# Patient Record
Sex: Female | Born: 1965 | Race: White | Hispanic: No | Marital: Single
Health system: Southern US, Community
[De-identification: ages and names within clinical notes are randomized; demographics above are authoritative.]

---

## 2017-02-16 ENCOUNTER — Emergency Department: Payer: BLUE CROSS/BLUE SHIELD

## 2017-02-16 ENCOUNTER — Emergency Department
Admission: EM | Admit: 2017-02-16 | Discharge: 2017-02-16 | Disposition: A | Discharge status: Home / Self Care | Payer: BLUE CROSS/BLUE SHIELD | Attending: Student in an Organized Health Care Education/Training Program | Admitting: Student in an Organized Health Care Education/Training Program

## 2017-02-16 DIAGNOSIS — Z87442 Personal history of urinary calculi: Secondary | ICD-10-CM | POA: Insufficient documentation

## 2017-02-16 DIAGNOSIS — R109 Unspecified abdominal pain: Secondary | ICD-10-CM

## 2017-02-16 LAB — CBC WITH DIFFERENTIAL/PLATELET
BASOS ABS: 0.1 10*3/uL (ref 0–0.1)
Basophils Relative: 1 %
EOS ABS: 0.1 10*3/uL (ref 0–0.7)
EOS PCT: 3 %
HCT: 38.4 % (ref 35.0–47.0)
HEMOGLOBIN: 13.4 g/dL (ref 12.0–16.0)
LYMPHS PCT: 38 %
Lymphs Abs: 2.2 10*3/uL (ref 1.0–3.6)
MCH: 31.8 pg (ref 26.0–34.0)
MCHC: 35 g/dL (ref 32.0–36.0)
MCV: 90.7 fL (ref 80.0–100.0)
Monocytes Absolute: 0.5 10*3/uL (ref 0.2–0.9)
Monocytes Relative: 8 %
NEUTROS PCT: 50 %
Neutro Abs: 3 10*3/uL (ref 1.4–6.5)
PLATELETS: 259 10*3/uL (ref 150–440)
RBC: 4.23 MIL/uL (ref 3.80–5.20)
RDW: 12.8 % (ref 11.5–14.5)
WBC: 5.9 10*3/uL (ref 3.6–11.0)

## 2017-02-16 LAB — BASIC METABOLIC PANEL
ANION GAP: 9 (ref 5–15)
BUN: 8 mg/dL (ref 6–20)
CHLORIDE: 104 mmol/L (ref 101–111)
CO2: 26 mmol/L (ref 22–32)
Calcium: 9.7 mg/dL (ref 8.9–10.3)
Creatinine, Ser: 0.9 mg/dL (ref 0.44–1.00)
GFR calc Af Amer: >60 mL/min (ref >60)
Glucose, Bld: 125 mg/dL — ABNORMAL HIGH (ref 65–99)
POTASSIUM: 3.5 mmol/L (ref 3.5–5.1)
SODIUM: 139 mmol/L (ref 135–145)

## 2017-02-16 LAB — URINALYSIS, COMPLETE (UACMP) WITH MICROSCOPIC
BILIRUBIN URINE: NEGATIVE
GLUCOSE, UA: NEGATIVE mg/dL
HGB URINE DIPSTICK: NEGATIVE
KETONES UR: 5 mg/dL — AB
LEUKOCYTES UA: NEGATIVE
Nitrite: NEGATIVE
PROTEIN: NEGATIVE mg/dL
Specific Gravity, Urine: 1.003 — ABNORMAL LOW (ref 1.005–1.030)
pH: 6 (ref 5.0–8.0)

## 2017-02-16 MED ORDER — CYCLOBENZAPRINE HCL 10 MG PO TABS: 10.0000 mg | ORAL_TABLET | Freq: Three times a day (TID) | ORAL | 0 refills | Status: AC | PRN

## 2017-02-16 MED ORDER — KETOROLAC TROMETHAMINE 30 MG/ML IJ SOLN
15.0000 mg | Freq: Once | INTRAMUSCULAR | Status: AC
  Administered 2017-02-16: 15 mg via INTRAVENOUS
  Filled 2017-02-16: qty 1

## 2017-02-16 MED ORDER — NAPROXEN 375 MG PO TABS: 375.0000 mg | ORAL_TABLET | Freq: Two times a day (BID) | ORAL | 0 refills | Status: AC

## 2017-02-16 MED ORDER — SODIUM CHLORIDE 0.9 % IV BOLUS (SEPSIS)
1000.0000 mL | Freq: Once | INTRAVENOUS | Status: AC
  Administered 2017-02-16: 1000 mL via INTRAVENOUS

## 2017-02-16 NOTE — Discharge Instructions (Signed)

## 2017-02-16 NOTE — ED Provider Notes (Signed)
[facility] Emergency Department Provider Note    First MD Initiated Contact with Patient 02/16/17 1319     (approximate)  I have reviewed the triage vital signs and the nursing notes.   HISTORY  Chief Complaint Flank Pain    HPI Michelle Mcgee is a 51 y.o. female who reportedly has a history of kidney stones presents with chief complaint of left-sided flank pain and trouble using the restroom over the past 3 days. No fevers. No nausea or vomiting. States that in the past she would hear the splash when she was urinating and that's how she knew she had a kidney stone. Says that she is not hearing the splash now. States that she is concerned that she is not passing the stones.  Discussed the pain as mild to moderate in severity.   No past medical history on file. No family history on file. No past surgical history on file. There are no active problems to display for this patient.     Prior to Admission medications   Not on File    Allergies Patient has no allergy information on record.    Social History Social History  Substance Use Topics  . Smoking status: Not on file  . Smokeless tobacco: Not on file  . Alcohol use Not on file    Review of Systems Patient denies headaches, rhinorrhea, blurry vision, numbness, shortness of breath, chest pain, edema, cough, abdominal pain, nausea, vomiting, diarrhea, dysuria, fevers, rashes or hallucinations unless otherwise stated above in HPI. ____________________________________________   PHYSICAL EXAM:  VITAL SIGNS: Vitals:   02/16/17 1322  BP: (!) 144/89  Pulse: 90  Resp: 16  Temp: 98.4 F (36.9 C)  SpO2: 100%    Constitutional: Alert and oriented. Well appearing and in no acute distress. Eyes: Conjunctivae are normal.  Head: Atraumatic. Nose: No congestion/rhinnorhea. Mouth/Throat: Mucous membranes are moist.   Neck: No stridor. Painless ROM.  Cardiovascular: Normal rate, regular  rhythm. Grossly normal heart sounds.  Good peripheral circulation. Respiratory: Normal respiratory effort.  No retractions. Lungs CTAB. Gastrointestinal: Soft and nontender. No distention. No abdominal bruits. No CVA tenderness. Musculoskeletal: No lower extremity tenderness nor edema.  No joint effusions. Neurologic:  Normal speech and language. No gross focal neurologic deficits are appreciated. No facial droop Skin:  Skin is warm, dry and intact. No rash noted. Psychiatric: Mood and affect are normal. Speech and behavior are normal.  ____________________________________________   LABS (all labs ordered are listed, but only abnormal results are displayed)  Results for orders placed or performed during the hospital encounter of 02/16/17 (from the past 24 hour(s))  CBC with Differential/Platelet     Status: None   Collection Time: 02/16/17  1:23 PM  Result Value Ref Range   WBC 5.9 3.6 - 11.0 K/uL   RBC 4.23 3.80 - 5.20 MIL/uL   Hemoglobin 13.4 12.0 - 16.0 g/dL   HCT 16.1 09.6 - 04.5 %   MCV 90.7 80.0 - 100.0 fL   MCH 31.8 26.0 - 34.0 pg   MCHC 35.0 32.0 - 36.0 g/dL   RDW 40.9 81.1 - 91.4 %   Platelets 259 150 - 440 K/uL   Neutrophils Relative % 50 %   Neutro Abs 3.0 1.4 - 6.5 K/uL   Lymphocytes Relative 38 %   Lymphs Abs 2.2 1.0 - 3.6 K/uL   Monocytes Relative 8 %   Monocytes Absolute 0.5 0.2 - 0.9 K/uL   Eosinophils Relative 3 %  Eosinophils Absolute 0.1 0 - 0.7 K/uL   Basophils Relative 1 %   Basophils Absolute 0.1 0 - 0.1 K/uL  Basic metabolic panel     Status: Abnormal   Collection Time: 02/16/17  1:23 PM  Result Value Ref Range   Sodium 139 135 - 145 mmol/L   Potassium 3.5 3.5 - 5.1 mmol/L   Chloride 104 101 - 111 mmol/L   CO2 26 22 - 32 mmol/L   Glucose, Bld 125 (H) 65 - 99 mg/dL   BUN 8 6 - 20 mg/dL   Creatinine, Ser 1.61 0.44 - 1.00 mg/dL   Calcium 9.7 8.9 - 09.6 mg/dL   GFR calc non Af Amer >60 >60 mL/min   GFR calc Af Amer >60 >60 mL/min   Anion gap 9 5 -  15  Urinalysis, Complete w Microscopic     Status: Abnormal   Collection Time: 02/16/17  3:37 PM  Result Value Ref Range   Color, Urine STRAW (A) YELLOW   APPearance CLEAR (A) CLEAR   Specific Gravity, Urine 1.003 (L) 1.005 - 1.030   pH 6.0 5.0 - 8.0   Glucose, UA NEGATIVE NEGATIVE mg/dL   Hgb urine dipstick NEGATIVE NEGATIVE   Bilirubin Urine NEGATIVE NEGATIVE   Ketones, ur 5 (A) NEGATIVE mg/dL   Protein, ur NEGATIVE NEGATIVE mg/dL   Nitrite NEGATIVE NEGATIVE   Leukocytes, UA NEGATIVE NEGATIVE   RBC / HPF 0-5 0 - 5 RBC/hpf   WBC, UA 0-5 0 - 5 WBC/hpf   Bacteria, UA RARE (A) NONE SEEN   Squamous Epithelial / LPF 0-5 (A) NONE SEEN   ____________________________________________ ____________________________________________  RADIOLOGY  I personally reviewed all radiographic images ordered to evaluate for the above acute complaints and reviewed radiology reports and findings.  These findings were personally discussed with the patient.  Please see medical record for radiology report.  ____________________________________________   PROCEDURES  Procedure(s) performed:  Procedures    Critical Care performed: no ____________________________________________   INITIAL IMPRESSION / ASSESSMENT AND PLAN / ED COURSE  Pertinent labs & imaging results that were available during my care of the patient were reviewed by me and considered in my medical decision making (see chart for details).  DDX:  Stone, pyelo, uti, colitis, msk strain  Michelle Mcgee is a 51 y.o. who presents to the ED with Hx of kidneys p/w left flank pain. No fevers, no systemic symptoms. - urinary symptoms. Denies trauma or injury. Afebrile in ED. Exam as above. Flank TTP, otherwise abdominal exam is benign. No peritoneal signs. Possible kidney stone, cystitis, or pyelonephritis.   Checking urine. UA with no blood or evidence of infection CT Stone with no evidence of stone Clinical picture is not consistent with  appendicitis, diverticulitis, pancreatitis, cholecystitis, bowel perforation, aortic dissection, splenic injury or acute abdominal process at this time.  Pain improved, tolerating PO. Repeat ABD exam benign, will plan supportive treatment and early follow up for recheck.       ____________________________________________   FINAL CLINICAL IMPRESSION(S) / ED DIAGNOSES  Final diagnoses:  Left flank pain      NEW MEDICATIONS STARTED DURING THIS VISIT:  New Prescriptions   No medications on file     Note:  This document was prepared using Dragon voice recognition software and may include unintentional dictation errors.    Willy Eddy, MD 02/16/17 [phone]

## 2017-02-16 NOTE — ED Triage Notes (Signed)
Pt came to ED via pov c/o left sided flank pain and trouble using the bathroom. History of kidney stones. VS stable.

## 2017-02-16 NOTE — ED Notes (Signed)
Patient denies pain and is resting comfortably.  

## 2018-04-30 IMAGING — CT CT RENAL STONE PROTOCOL
2 of 4 series · 16 of 46 positions shown, 18 images · non-contrast
Comparison: None.

CLINICAL DATA: Left-sided flank pain.

EXAM:
CT ABDOMEN AND PELVIS WITHOUT CONTRAST
TECHNIQUE: Multidetector CT imaging of the abdomen and pelvis was performed
following the standard protocol without IV contrast.

[Series 2: stone full standard · axial · 0.68mm/px · z∈[-819,-429]mm · 13 of 86 slices shown, 15 images]
[im 4/86  soft-tissue]
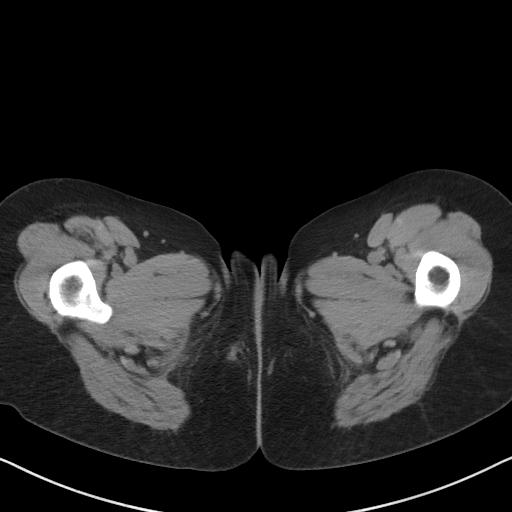
[im 4/86  bone]
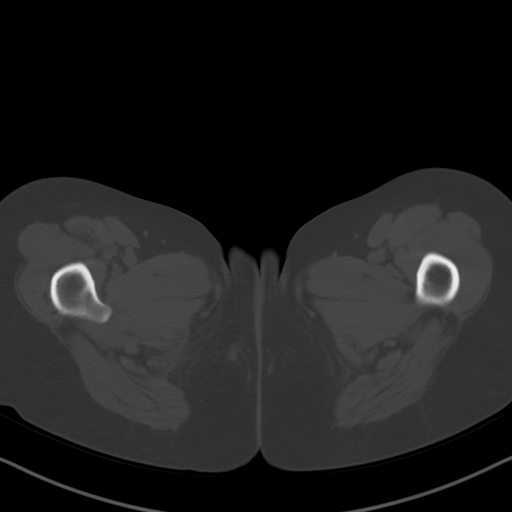
[im 11/86  soft-tissue]
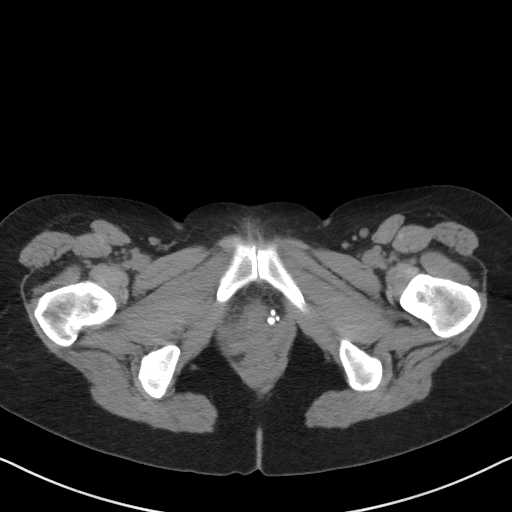
[im 18/86  soft-tissue]
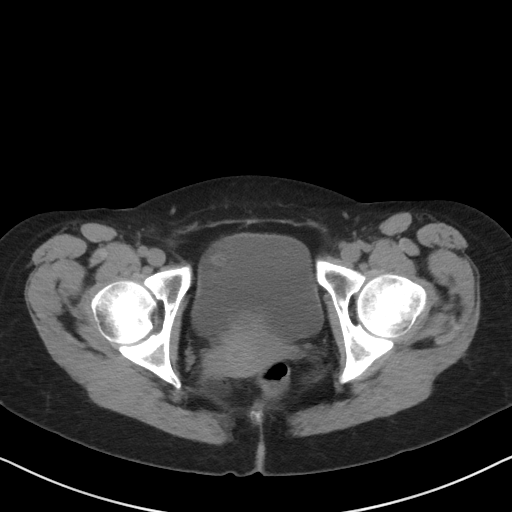
[im 25/86  soft-tissue]
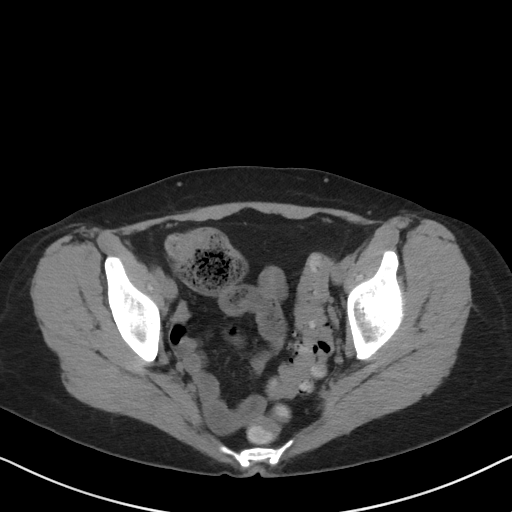
[im 29/86  soft-tissue]
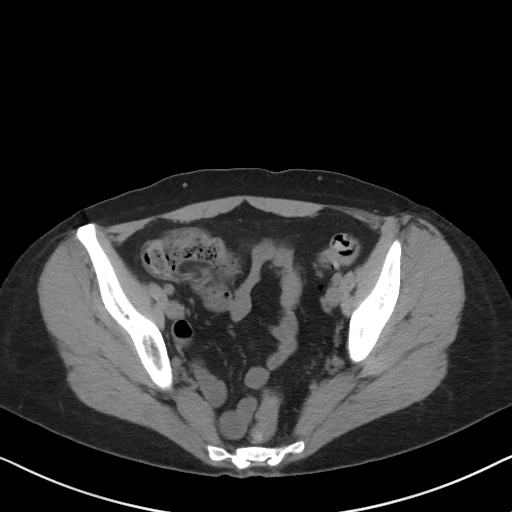
[im 36/86  soft-tissue]
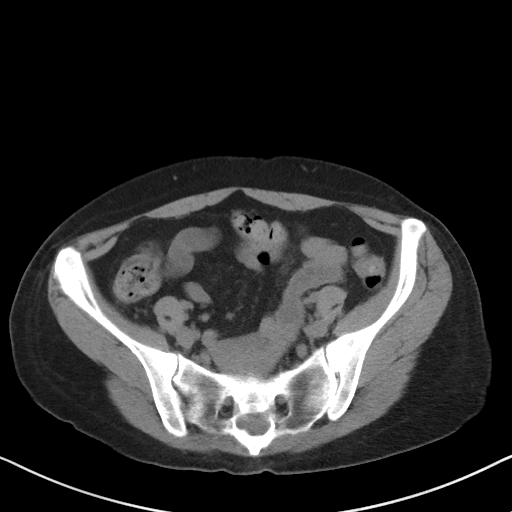
[im 43/86  soft-tissue]
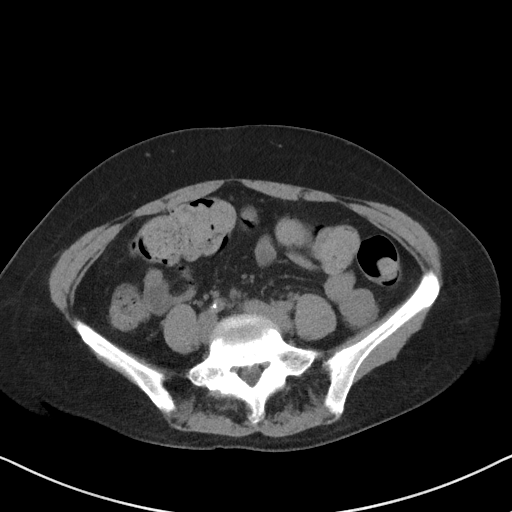
[im 50/86  soft-tissue]
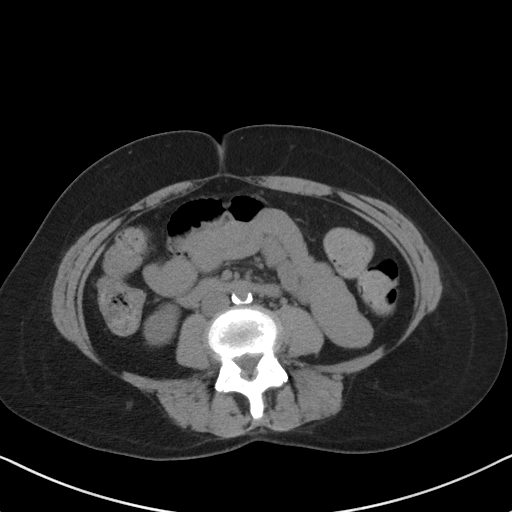
[im 57/86  soft-tissue]
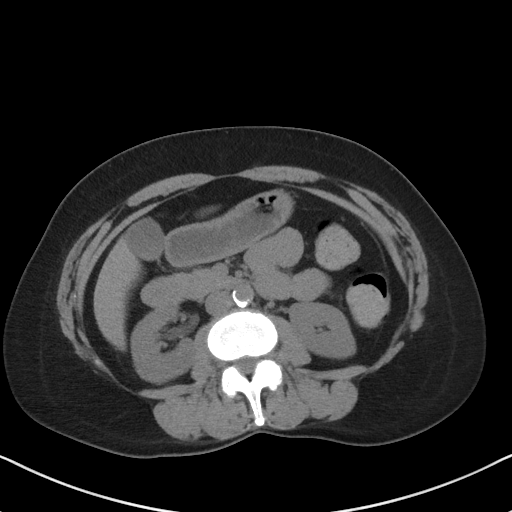
[im 57/86  bone]
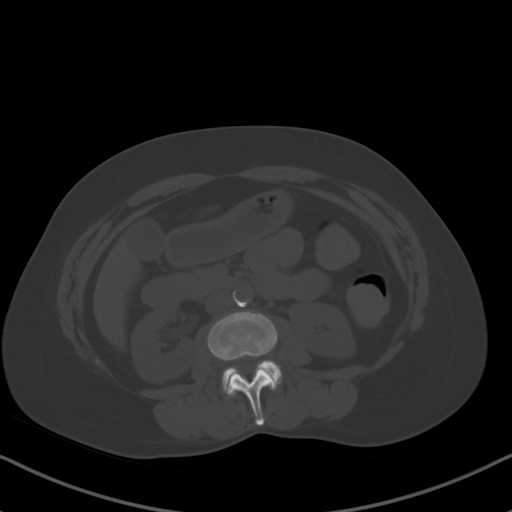
[im 61/86  soft-tissue]
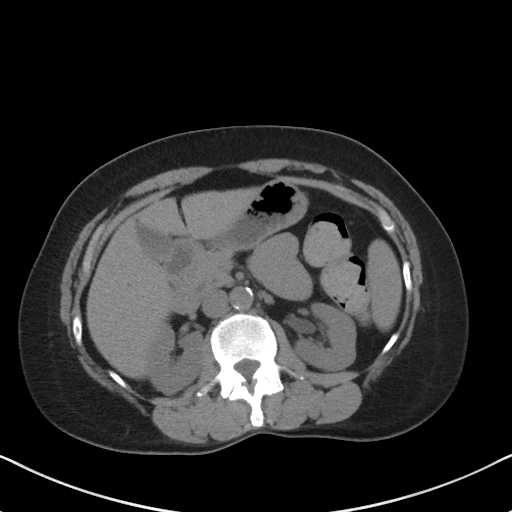
[im 68/86  soft-tissue]
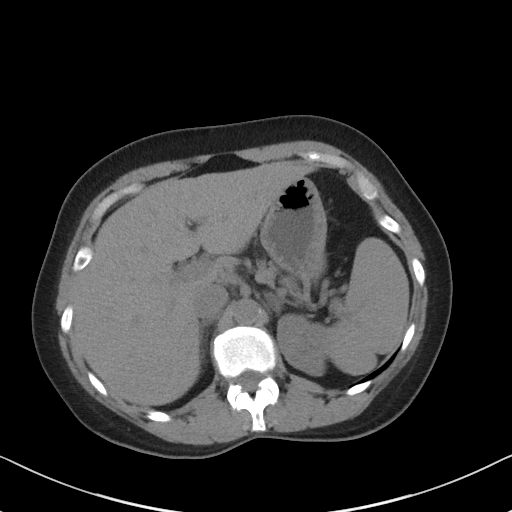
[im 75/86  soft-tissue]
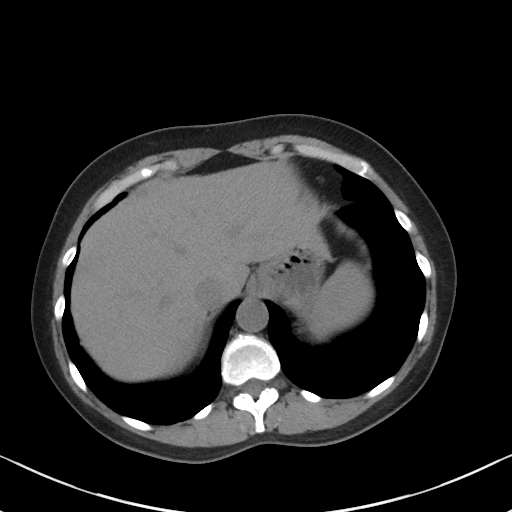
[im 82/86  soft-tissue]
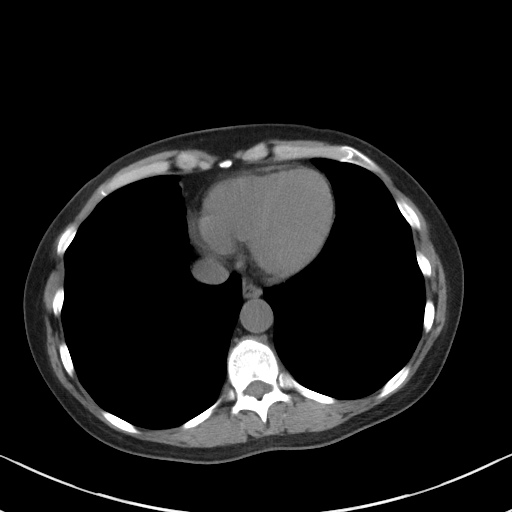

[Series 5: coronal · coronal · 0.65mm/px · 3 of 117 slices shown]
[im 39/117  soft-tissue]
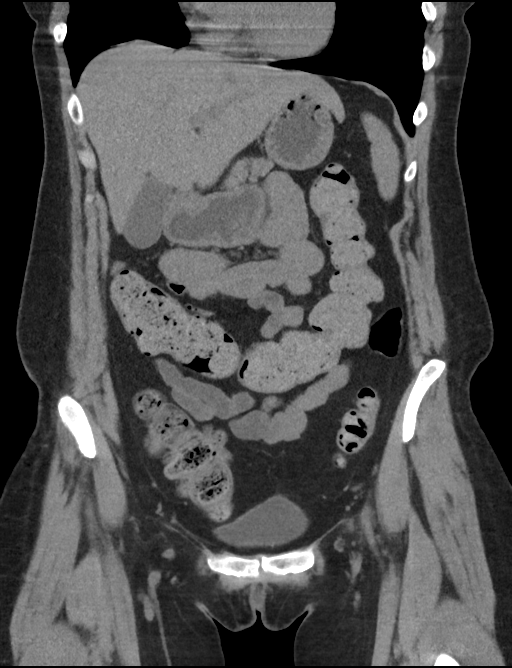
[im 52/117  soft-tissue]
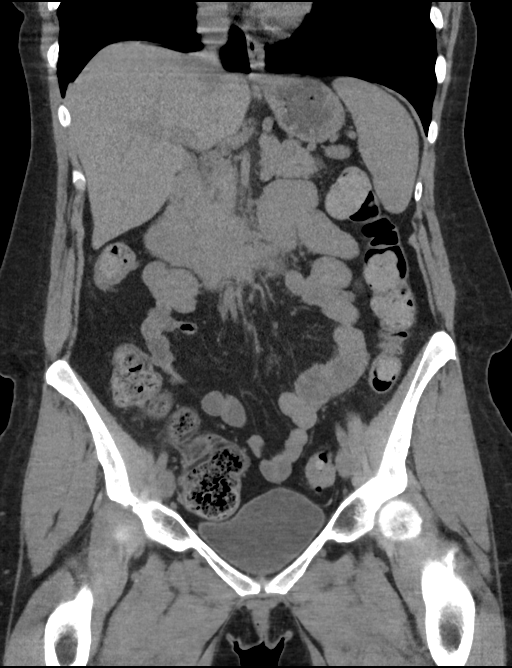
[im 65/117  soft-tissue]
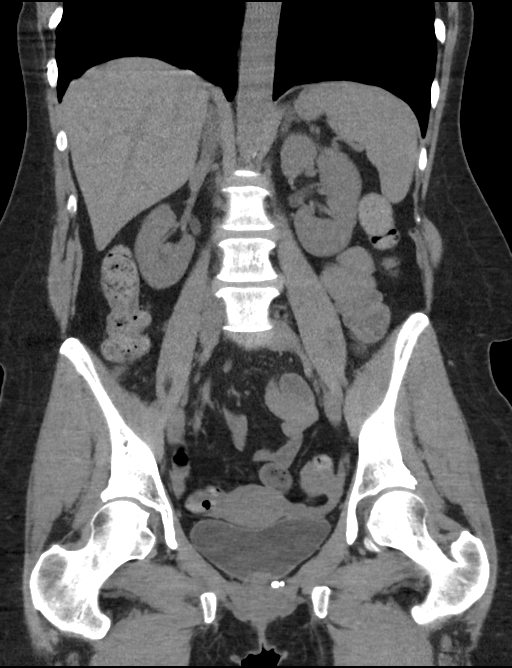

[16 of 46 positions shown; findings below may reference images not displayed]

FINDINGS: Lower chest: No acute abnormality.

Hepatobiliary: No focal liver abnormality is seen. Focal fat along
the falciform ligament. No gallstones, gallbladder wall thickening,
or biliary dilatation.

Pancreas: Unremarkable. No pancreatic ductal dilatation or
surrounding inflammatory changes.

Spleen: Normal in size without focal abnormality.

Adrenals/Urinary Tract: Adrenal glands are unremarkable. Kidneys are
normal, without renal calculi, focal lesion, or hydronephrosis.
Bladder is unremarkable.

Stomach/Bowel: Stomach is within normal limits. Appendix is not well
visualized, however there are no secondary signs of inflammatory
process in the right lower quadrant. No evidence of bowel wall
thickening, distention, or inflammatory changes. Sigmoid
diverticulosis.

Vascular/Lymphatic: Aortic atherosclerosis. No enlarged abdominal or
pelvic lymph nodes.

Reproductive: Uterus and bilateral adnexa are unremarkable.

Other: No free fluid or pneumoperitoneum.

Musculoskeletal: No acute or significant osseous findings.
IMPRESSION: 1. No acute intra-abdominal process.  No renal or ureteral calculi.
2.  Aortic atherosclerosis ([id number]).
